# Patient Record
Sex: Male | Born: 1980 | Race: White | Hispanic: No | Marital: Single | Smoking: Current every day smoker
Health system: Southern US, Community
[De-identification: ages and names within clinical notes are randomized; demographics above are authoritative.]

## PROBLEM LIST (undated history)

## (undated) DIAGNOSIS — F1910 Other psychoactive substance abuse, uncomplicated: Secondary | ICD-10-CM

## (undated) DIAGNOSIS — K219 Gastro-esophageal reflux disease without esophagitis: Secondary | ICD-10-CM

## (undated) HISTORY — DX: Other psychoactive substance abuse, uncomplicated: F19.10

## (undated) HISTORY — DX: Gastro-esophageal reflux disease without esophagitis: K21.9

---

## 2000-02-20 HISTORY — PX: WISDOM TOOTH EXTRACTION: SHX21

## 2006-05-31 ENCOUNTER — Emergency Department (HOSPITAL_COMMUNITY): Admission: EM | Admit: 2006-05-31 | Discharge: 2006-05-31 | Payer: Self-pay | Admitting: *Deleted

## 2012-09-14 ENCOUNTER — Encounter (HOSPITAL_COMMUNITY): Payer: Self-pay | Admitting: Emergency Medicine

## 2012-09-14 ENCOUNTER — Emergency Department (HOSPITAL_COMMUNITY)
Admission: EM | Admit: 2012-09-14 | Discharge: 2012-09-14 | Disposition: A | Discharge status: Home / Self Care | Payer: Worker's Compensation | Attending: Emergency Medicine | Admitting: Emergency Medicine

## 2012-09-14 ENCOUNTER — Emergency Department (HOSPITAL_COMMUNITY): Payer: Worker's Compensation

## 2012-09-14 DIAGNOSIS — Z79899 Other long term (current) drug therapy: Secondary | ICD-10-CM | POA: Insufficient documentation

## 2012-09-14 DIAGNOSIS — S61219A Laceration without foreign body of unspecified finger without damage to nail, initial encounter: Secondary | ICD-10-CM

## 2012-09-14 DIAGNOSIS — F17200 Nicotine dependence, unspecified, uncomplicated: Secondary | ICD-10-CM | POA: Insufficient documentation

## 2012-09-14 DIAGNOSIS — Y990 Civilian activity done for income or pay: Secondary | ICD-10-CM | POA: Insufficient documentation

## 2012-09-14 DIAGNOSIS — Y9269 Other specified industrial and construction area as the place of occurrence of the external cause: Secondary | ICD-10-CM | POA: Insufficient documentation

## 2012-09-14 DIAGNOSIS — S61409A Unspecified open wound of unspecified hand, initial encounter: Secondary | ICD-10-CM | POA: Insufficient documentation

## 2012-09-14 DIAGNOSIS — W268XXA Contact with other sharp object(s), not elsewhere classified, initial encounter: Secondary | ICD-10-CM | POA: Insufficient documentation

## 2012-09-14 MED ORDER — BUPIVACAINE HCL (PF) 0.5 % IJ SOLN
20.0000 mL | Freq: Once | INTRAMUSCULAR | Status: AC
  Administered 2012-09-14: 20 mL

## 2012-09-14 MED ORDER — HYDROCODONE-ACETAMINOPHEN 5-325 MG PO TABS: 1.0000 | ORAL_TABLET | Freq: Four times a day (QID) | ORAL | Status: DC | PRN

## 2012-09-14 MED ORDER — IBUPROFEN 800 MG PO TABS: 800.0000 mg | ORAL_TABLET | Freq: Three times a day (TID) | ORAL | Status: DC

## 2012-09-14 MED ORDER — TETANUS-DIPHTH-ACELL PERTUSSIS 5-2.5-18.5 LF-MCG/0.5 IM SUSP
0.5000 mL | Freq: Once | INTRAMUSCULAR | Status: AC
  Administered 2012-09-14: 0.5 mL via INTRAMUSCULAR
  Filled 2012-09-14 (×2): qty 0.5

## 2012-09-14 NOTE — ED Notes (Signed)
PT. REPORTS RIGHT DISTAL MIDDLE FINGER LACERATION SUSTAINED AT WORK ( [employer]) WHILE MOPPING THIS EVENING . DRESSING APPLIED PTA. NO BLEEDING .

## 2012-09-14 NOTE — ED Provider Notes (Signed)
CSN: [id number]     Arrival date & time 09/14/12  0206 History     First MD Initiated Contact with Patient 09/14/12 0217     Chief Complaint  Patient presents with  . Finger Injury   HPI Ivan Miller is a 32 y.o. male presents with an avulsion laceration to the finger pad of the middle finger on the right hand. This occurred on a broken mop handle while at work. Pain is been moderate to severe, throbbing, isn't bleeding, and is currently hemostatic, he is taking a medicine for it. Touching it hurts worse. No other alleviating or exacerbating factors no other associated symptoms.  History reviewed. No pertinent past medical history. History reviewed. No pertinent past surgical history. No family history on file. History  Substance Use Topics  . Smoking status: Current Every Day Smoker  . Smokeless tobacco: Not on file  . Alcohol Use: No    Review of Systems At least 10pt or greater review of systems completed and are negative except where specified in the HPI.  Allergies  Review of patient's allergies indicates no known allergies.  Home Medications   Current Outpatient Rx  Name  Route  Sig  Dispense  Refill  . guaiFENesin (MUCINEX) 600 MG 12 hr tablet   Oral   Take 600-1,800 mg by mouth 2 (two) times daily as needed for congestion.         Marland Kitchen loratadine (CLARITIN) 10 MG tablet   Oral   Take 10 mg by mouth daily as needed for allergies.         . Naproxen Sodium (ALEVE PO)   Oral   Take 1 tablet by mouth daily as needed (back pain).         . phenylephrine (SUDAFED PE) 10 MG TABS   Oral   Take 10 mg by mouth every 4 (four) hours as needed (for sinus congestion).          BP 129/69  Pulse 72  Temp(Src) 98.7 F (37.1 C)  Resp 18  SpO2 98% Physical Exam  Nursing notes reviewed.  Electronic medical record reviewed. VITAL SIGNS:   Filed Vitals:   09/14/12 0209 09/14/12 0536  BP: 129/69 110/75  Pulse: 72 50  Temp: 98.7 F (37.1 C)   Resp: 18 18   SpO2: 98% 97%   CONSTITUTIONAL: Awake, oriented, appears non-toxic HENT: Atraumatic, normocephalic, oral mucosa pink and moist, airway patent. Nares patent without drainage. External ears normal. EYES: Conjunctiva clear, EOMI, PERRLA NECK: Trachea midline, non-tender, supple CARDIOVASCULAR: Normal heart rate, Normal rhythm, No murmurs, rubs, gallops PULMONARY/CHEST: Clear to auscultation, no rhonchi, wheezes, or rales. Symmetrical breath sounds. Non-tender. ABDOMINAL: Non-distended, soft, non-tender - no rebound or guarding.  BS normal. NEUROLOGIC: Non-focal, moving all four extremities, no gross sensory or motor deficits. EXTREMITIES: No clubbing, cyanosis, or edema. Avulsion laceration to the finger pad of the long finger on the distal phalanx of the right hand, C-shaped approximately 4.5 cm in circle with a proximal area of tissue attached to the middle of the volar aspect of the finger.  Tissue appears vital and pinks after compression. SKIN: Warm, Dry, No erythema, No rash  ED Course   LACERATION REPAIR Date/Time: 09/14/2012 9:02 AM Performed by: Jones Skene Authorized by: Jones Skene Consent: Verbal consent obtained. Risks and benefits: risks, benefits and alternatives were discussed Consent given by: patient Patient identity confirmed: verbally with patient Body area: upper extremity Laceration length: 4.5 cm Foreign bodies: no foreign bodies Tendon  involvement: none Nerve involvement: none Vascular damage: no Anesthesia: digital block Local anesthetic: bupivacaine 0.5% without epinephrine and lidocaine 2% without epinephrine Anesthetic total: 6 ml Patient sedated: no Preparation: Patient was prepped and draped in the usual sterile fashion. Irrigation solution: saline Irrigation method: jet lavage Amount of cleaning: extensive Debridement: none Degree of undermining: none Skin closure: 4-0 Prolene and 5-0 Prolene Number of sutures: 7 Technique:  simple Approximation: close Approximation difficulty: complex Dressing: antibiotic ointment and 4x4 sterile gauze Patient tolerance: Patient tolerated the procedure well with no immediate complications.   (including critical care time)  Labs Reviewed - No data to display Dg Hand 2 View Right  09/14/2012   *RADIOLOGY REPORT*  Clinical Data: Injury.  Rule out foreign body to the distal little finger.  RIGHT HAND - 2 VIEW  Comparison: None.  Findings: There is soft tissue regularity involving the pad of the middle finger, consistent with laceration.  No radiodense foreign body.  No acute fracture or subluxation.  IMPRESSION: No radiodense foreign body.  No fracture or malalignment.   Original Report Authenticated By: Tiburcio Pea   1. Finger laceration, initial encounter     MDM  Patient arrives with avulsion laceration to the pad of his long finger on the right hand. This area is neurovascularly intact, the fingertip is neurovascularly intact, there is good capillary refill.  Avulsion was closed with good approximation. This area was extensively irrigated with approximately 60 cc of normal saline under pressure from a syringe, this is after he had it soaking in dilute iodine solution per nursing. No foreign body seen on x-ray.  Jones Skene, MD 09/14/12 [phone]

## 2012-09-21 ENCOUNTER — Ambulatory Visit (INDEPENDENT_AMBULATORY_CARE_PROVIDER_SITE_OTHER): Payer: BC Managed Care – PPO | Admitting: Family Medicine

## 2012-09-21 VITALS — BP 98/62 | HR 64 | Temp 98.1°F | Resp 16 | Ht 71.0 in | Wt 208.0 lb

## 2012-09-21 DIAGNOSIS — K0889 Other specified disorders of teeth and supporting structures: Secondary | ICD-10-CM

## 2012-09-21 DIAGNOSIS — K089 Disorder of teeth and supporting structures, unspecified: Secondary | ICD-10-CM

## 2012-09-21 DIAGNOSIS — K047 Periapical abscess without sinus: Secondary | ICD-10-CM

## 2012-09-21 MED ORDER — PENICILLIN G BENZATHINE 1200000 UNIT/2ML IM SUSP
1.2000 10*6.[IU] | Freq: Once | INTRAMUSCULAR | Status: AC
  Administered 2012-09-21: 1.2 10*6.[IU] via INTRAMUSCULAR

## 2012-09-21 MED ORDER — AMOXICILLIN 500 MG PO CAPS: 500.0000 mg | ORAL_CAPSULE | Freq: Three times a day (TID) | ORAL | Status: DC

## 2012-09-21 MED ORDER — LIDOCAINE VISCOUS 2 % MT SOLN: OROMUCOSAL | Status: DC

## 2012-09-21 NOTE — Patient Instructions (Signed)
°  Dental Abscess °A dental abscess is a collection of infected fluid (pus) from a bacterial infection in the inner part of the tooth (pulp). It usually occurs at the end of the tooth's root.  °CAUSES  °· Severe tooth decay. °· Trauma to the tooth that allows bacteria to enter into the pulp, such as a broken or chipped tooth. °SYMPTOMS  °· Severe pain in and around the infected tooth. °· Swelling and redness around the abscessed tooth or in the mouth or face. °· Tenderness. °· Pus drainage. °· Bad breath. °· Bitter taste in the mouth. °· Difficulty swallowing. °· Difficulty opening the mouth. °· Nausea. °· Vomiting. °· Chills. °· Swollen neck glands. °DIAGNOSIS  °· A medical and dental history will be taken. °· An examination will be performed by tapping on the abscessed tooth. °· X-rays may be taken of the tooth to identify the abscess. °TREATMENT °The goal of treatment is to eliminate the infection. You may be prescribed antibiotic medicine to stop the infection from spreading. A root canal may be performed to save the tooth. If the tooth cannot be saved, it may be pulled (extracted) and the abscess may be drained.  °HOME CARE INSTRUCTIONS °· Only take over-the-counter or prescription medicines for pain, fever, or discomfort as directed by your caregiver. °· Rinse your mouth (gargle) often with salt water (¼ tsp salt in 8 oz [250 ml] of warm water) to relieve pain or swelling. °· Do not drive after taking pain medicine (narcotics). °· Do not apply heat to the outside of your face. °· Return to your dentist for further treatment as directed. °SEEK MEDICAL CARE IF: °· Your pain is not helped by medicine. °· Your pain is getting worse instead of better. °SEEK IMMEDIATE MEDICAL CARE IF: °· You have a fever or persistent symptoms for more than 2 3 days. °· You have a fever and your symptoms suddenly get worse. °· You have chills or a very bad headache. °· You have problems breathing or swallowing. °· You have trouble  opening your mouth. °· You have swelling in the neck or around the eye. °Document Released: 02/05/2005 Document Revised: 10/31/2011 Document Reviewed: 05/16/2010 °ExitCare® Patient Information ©2014 [employer]. ° ° °

## 2012-09-21 NOTE — Progress Notes (Signed)
[facility]:  Office Visit  Chief Complaint:  Chief Complaint  Patient presents with  . Facial Swelling    left sided x today  . Jaw Pain    HPI: Ivan Miller is a 32 y.o. male who complains of  Facial pain and last night he had , it was swollen, he woke up this morning, he has some numbness He denies fevers, chills.  Has had problems with his teeth before. He is making saliva. He denies this ever happneing before. He has taken some ibupofren last night, 800 mg.   Past Medical History  Diagnosis Date  . GERD (gastroesophageal reflux disease)   . Substance abuse    Past Surgical History  Procedure Laterality Date  . Wisdom tooth extraction  2002   History   Social History  . Marital Status: Single    Spouse Name: N/A    Number of Children: N/A  . Years of Education: N/A   Social History Main Topics  . Smoking status: Current Every Day Smoker  . Smokeless tobacco: Never Used  . Alcohol Use: No  . Drug Use: No  . Sexually Active: None   Other Topics Concern  . None   Social History Narrative  . None   History reviewed. No pertinent family history. No Known Allergies Prior to Admission medications   Medication Sig Start Date End Date Taking? Authorizing Provider  guaiFENesin (MUCINEX) 600 MG 12 hr tablet Take 600-1,800 mg by mouth 2 (two) times daily as needed for congestion.   Yes Historical Provider, MD  ibuprofen (ADVIL,MOTRIN) 800 MG tablet Take 1 tablet (800 mg total) by mouth 3 (three) times daily. 09/14/12  Yes John-Adam Bonk, MD  loratadine (CLARITIN) 10 MG tablet Take 10 mg by mouth daily as needed for allergies.   Yes Historical Provider, MD  Naproxen Sodium (ALEVE PO) Take 1 tablet by mouth daily as needed (back pain).   Yes Historical Provider, MD  phenylephrine (SUDAFED PE) 10 MG TABS Take 10 mg by mouth every 4 (four) hours as needed (for sinus congestion).   Yes Historical Provider, MD  HYDROcodone-acetaminophen (NORCO/VICODIN)  5-325 MG per tablet Take 1-2 tablets by mouth every 6 (six) hours as needed for pain. 09/14/12   John-Adam Bonk, MD     ROS: The patient denies fevers, chills, night sweats, unintentional weight loss, chest pain, palpitations, wheezing, dyspnea on exertion, nausea, vomiting, abdominal pain, dysuria, hematuria, melena, numbness, weakness, or tingling.   All other systems have been reviewed and were otherwise negative with the exception of those mentioned in the HPI and as above.    PHYSICAL EXAM: Filed Vitals:   09/21/12 1235  BP: 98/62  Pulse: 64  Temp: 98.1 F (36.7 C)  Resp: 16   Filed Vitals:   09/21/12 1235  Height: 5\' 11"  (1.803 m)  Weight: 208 lb (94.348 kg)   Body mass index is 29.02 kg/(m^2).  General: Alert, no acute distress HEENT:  Normocephalic, atraumatic, oropharynx patent. No exudates, there is gingival erythema and also decayed tooth on left lower tooth, + buccal swelling.  Cardiovascular:  Regular rate and rhythm, no rubs murmurs or gallops.  No Carotid bruits, radial pulse intact. No pedal edema.  Respiratory: Clear to auscultation bilaterally.  No wheezes, rales, or rhonchi.  No cyanosis, no use of accessory musculature GI: No organomegaly, abdomen is soft and non-tender, positive bowel sounds.  No masses. Skin: No rashes. Neurologic: Facial musculature symmetric. Psychiatric: Patient is appropriate throughout  our interaction. Lymphatic: No cervical lymphadenopathy Musculoskeletal: Gait intact.   LABS: No results found for this or any previous visit.   EKG/XRAY:   Primary read interpreted by Dr. Conley Rolls at [facility].   ASSESSMENT/PLAN: Encounter Diagnoses  Name Primary?  . Dental abscess Yes  . Pain, dental    He as given PCN IM x 1 Rx Amoxacillin 500 mg TID x 10 days Rx lidocane visous lidocaine to put on tooth area for pain control, take ibuprofen prn He has substance abuse issues so will defer pain meds Advise to tgo see dentist Precautions given if  worsening sxs He will see a dentist in [place] F/u in 2 days for recheck if does not have appt  Or sooner prn    [name], DO 09/21/2012 1:41 PM

## 2013-05-02 ENCOUNTER — Ambulatory Visit (INDEPENDENT_AMBULATORY_CARE_PROVIDER_SITE_OTHER): Payer: BC Managed Care – PPO | Admitting: Emergency Medicine

## 2013-05-02 VITALS — BP 108/70 | HR 78 | Temp 100.1°F | Resp 18 | Ht 70.5 in | Wt 220.2 lb

## 2013-05-02 DIAGNOSIS — J020 Streptococcal pharyngitis: Secondary | ICD-10-CM

## 2013-05-02 MED ORDER — PENICILLIN V POTASSIUM 500 MG PO TABS: 500.0000 mg | ORAL_TABLET | Freq: Four times a day (QID) | ORAL | Status: AC

## 2013-05-02 NOTE — Progress Notes (Signed)
[facility] [address], [place] [address] [phone]  Date:  05/02/2013   Name:  Ivan ServiceMichael A Crabtree   DOB:  [birth year]   MRN:  [id number]  PCP:  No primary provider on file.    Chief Complaint: Generalized Body Aches, Headache, Sore Throat and Anorexia   History of Present Illness:  Ivan ServiceMichael A Jawad is a 33 y.o. very pleasant male patient who presents with the following:  Ill for last 24 hours with sore throat, fever, difficulty swallowing.  Has occasional non productive cough and some mucoid nasal drainage.  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  No nausea or vomiting.  No rash. No improvement with over the counter medications or other home remedies. Denies other complaint or health concern today.   There are no active problems to display for this patient.   Past Medical History  Diagnosis Date  . GERD (gastroesophageal reflux disease)   . Substance abuse     Past Surgical History  Procedure Laterality Date  . Wisdom tooth extraction  2002    History  Substance Use Topics  . Smoking status: Current Every Day Smoker  . Smokeless tobacco: Never Used  . Alcohol Use: No    No family history on file.  No Known Allergies  Medication list has been reviewed and updated.  Current Outpatient Prescriptions on File Prior to Visit  Medication Sig Dispense Refill  . guaiFENesin (MUCINEX) 600 MG 12 hr tablet Take 600-1,800 mg by mouth 2 (two) times daily as needed for congestion.      Marland Kitchen. loratadine (CLARITIN) 10 MG tablet Take 10 mg by mouth daily as needed for allergies.      . Naproxen Sodium (ALEVE PO) Take 1 tablet by mouth daily as needed (back pain).      . phenylephrine (SUDAFED PE) 10 MG TABS Take 10 mg by mouth every 4 (four) hours as needed (for sinus congestion).       No current facility-administered medications on file prior to visit.    Review of Systems:  As per HPI, otherwise negative.    Physical Examination: Filed Vitals:   05/02/13 1321  BP: 108/70  Pulse: 78  Temp: 100.1 F (37.8 C)  Resp: 18   Filed Vitals:   05/02/13 1321  Height: 5' 10.5" (1.791 m)  Weight: 220 lb 4 oz (99.905 kg)   Body mass index is 31.15 kg/(m^2). Ideal Body Weight: Weight in (lb) to have BMI = 25: 176.4  GEN: WDWN, NAD, Non-toxic, A & O x 3 HEENT: Atraumatic, Normocephalic. Neck supple. No masses, No LAD.  Oropharyngeal erythema no exudate Ears and Nose: No external deformity. CV: RRR, No M/G/R. No JVD. No thrill. No extra heart sounds. PULM: CTA B, no wheezes, crackles, rhonchi. No retractions. No resp. distress. No accessory muscle use. ABD: S, NT, ND, +BS. No rebound. No HSM. EXTR: No c/c/e NEURO Normal gait.  PSYCH: Normally interactive. Conversant. Not depressed or anxious appearing.  Calm demeanor.    Assessment and Plan: Strep No test due exposure Pen VK   Signed,  Phillips OdorJeffery Domenico Achord, MD

## 2013-05-02 NOTE — Patient Instructions (Signed)
Strep Throat  Strep throat is an infection of the throat caused by a bacteria named Streptococcus pyogenes. Your caregiver may call the infection streptococcal "tonsillitis" or "pharyngitis" depending on whether there are signs of inflammation in the tonsils or back of the throat. Strep throat is most common in children aged 34 34 years during the cold months of the year, but it can occur in people of any age during any season. This infection is spread from person to person (contagious) through coughing, sneezing, or other close contact.  SYMPTOMS   · Fever or chills.  · Painful, swollen, red tonsils or throat.  · Pain or difficulty when swallowing.  · White or yellow spots on the tonsils or throat.  · Swollen, tender lymph nodes or "glands" of the neck or under the jaw.  · Red rash all over the body (rare).  DIAGNOSIS   Many different infections can cause the same symptoms. A test must be done to confirm the diagnosis so the right treatment can be given. A "rapid strep test" can help your caregiver make the diagnosis in a few minutes. If this test is not available, a light swab of the infected area can be used for a throat culture test. If a throat culture test is done, results are usually available in a day or two.  TREATMENT   Strep throat is treated with antibiotic medicine.  HOME CARE INSTRUCTIONS   · Gargle with 1 tsp of salt in 1 cup of warm water, 3 4 times per day or as needed for comfort.  · Family members who also have a sore throat or fever should be tested for strep throat and treated with antibiotics if they have the strep infection.  · Make sure everyone in your household washes their hands well.  · Do not share food, drinking cups, or personal items that could cause the infection to spread to others.  · You may need to eat a soft food diet until your sore throat gets better.  · Drink enough water and fluids to keep your urine clear or pale yellow. This will help prevent dehydration.  · Get plenty of  rest.  · Stay home from school, daycare, or work until you have been on antibiotics for 24 hours.  · Only take over-the-counter or prescription medicines for pain, discomfort, or fever as directed by your caregiver.  · If antibiotics are prescribed, take them as directed. Finish them even if you start to feel better.  SEEK MEDICAL CARE IF:   · The glands in your neck continue to enlarge.  · You develop a rash, cough, or earache.  · You cough up green, yellow-brown, or bloody sputum.  · You have pain or discomfort not controlled by medicines.  · Your problems seem to be getting worse rather than better.  SEEK IMMEDIATE MEDICAL CARE IF:   · You develop any new symptoms such as vomiting, severe headache, stiff or painful neck, chest pain, shortness of breath, or trouble swallowing.  · You develop severe throat pain, drooling, or changes in your voice.  · You develop swelling of the neck, or the skin on the neck becomes red and tender.  · You have a fever.  · You develop signs of dehydration, such as fatigue, dry mouth, and decreased urination.  · You become increasingly sleepy, or you cannot wake up completely.  Document Released: 02/03/2000 Document Revised: 01/23/2012 Document Reviewed: 04/06/2010  ExitCare® Patient Information ©2014 [employer].
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
